# Patient Record
Sex: Female | Born: 1955 | Race: Black or African American | Hispanic: No
Health system: Southern US, Community
[De-identification: ages and names within clinical notes are randomized; demographics above are authoritative.]

---

## 1999-05-22 ENCOUNTER — Ambulatory Visit (HOSPITAL_COMMUNITY): Admission: RE | Admit: 1999-05-22 | Discharge: 1999-05-22 | Payer: Self-pay | Admitting: Neurosurgery

## 1999-05-22 ENCOUNTER — Encounter: Payer: Self-pay | Admitting: Neurosurgery

## 1999-11-13 ENCOUNTER — Encounter: Payer: Self-pay | Admitting: Neurosurgery

## 1999-11-15 ENCOUNTER — Inpatient Hospital Stay (HOSPITAL_COMMUNITY): Admission: RE | Admit: 1999-11-15 | Discharge: 1999-11-16 | Payer: Self-pay | Admitting: Neurosurgery

## 1999-11-15 ENCOUNTER — Encounter: Payer: Self-pay | Admitting: Neurosurgery

## 1999-12-18 ENCOUNTER — Encounter: Payer: Self-pay | Admitting: Neurosurgery

## 1999-12-18 ENCOUNTER — Encounter: Admission: RE | Admit: 1999-12-18 | Discharge: 1999-12-18 | Payer: Self-pay | Admitting: Neurosurgery

## 2000-07-16 ENCOUNTER — Ambulatory Visit (HOSPITAL_COMMUNITY): Admission: RE | Admit: 2000-07-16 | Discharge: 2000-07-16 | Payer: Self-pay | Admitting: Neurosurgery

## 2000-07-16 ENCOUNTER — Encounter: Payer: Self-pay | Admitting: Neurosurgery

## 2001-07-16 ENCOUNTER — Encounter: Payer: Self-pay | Admitting: Neurosurgery

## 2001-07-16 ENCOUNTER — Ambulatory Visit (HOSPITAL_COMMUNITY): Admission: RE | Admit: 2001-07-16 | Discharge: 2001-07-16 | Payer: Self-pay | Admitting: Neurosurgery

## 2004-07-30 ENCOUNTER — Emergency Department: Payer: Self-pay | Admitting: Emergency Medicine

## 2005-05-05 ENCOUNTER — Emergency Department: Payer: Self-pay | Admitting: Emergency Medicine

## 2005-11-22 ENCOUNTER — Emergency Department: Payer: Self-pay | Admitting: Emergency Medicine

## 2006-10-11 IMAGING — CT CT ABD-PELV W/O CM
1 of 2 series · 16 of 32 positions shown, 20 images · non-contrast
Comparison: none

REASON FOR EXAM: Kidney stone
COMMENTS:

PROCEDURE:     CT  - CT ABDOMEN AND PELVIS W[DATE]  [DATE]
RESULT:     The liver and spleen are normal.  The adrenals are normal.  The
pancreas is normal.  No focal renal abnormalities are identified.  There is
no bowel distention.  No evidence of obstructing ureteral stone.

[Series 2: soft tissue · axial · 0.79mm/px · z∈[-778,-400]mm · 16 of 141 slices shown, 20 images]
[im 10/141  soft-tissue]
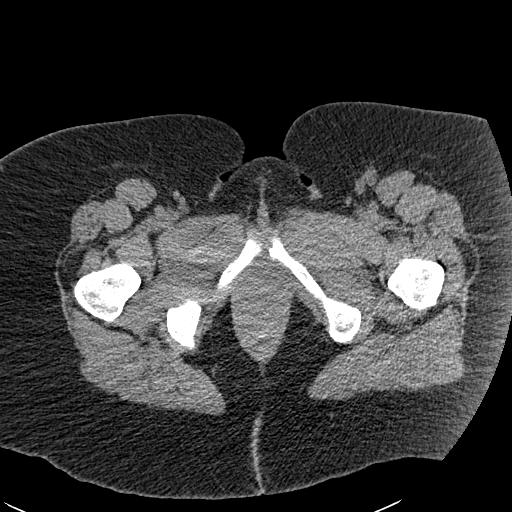
[im 10/141  bone]
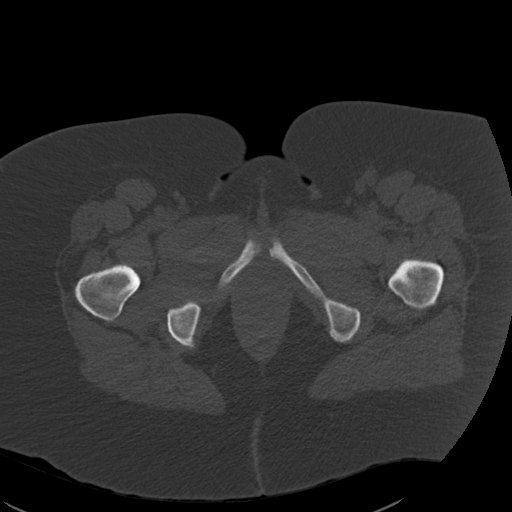
[im 20/141  soft-tissue]
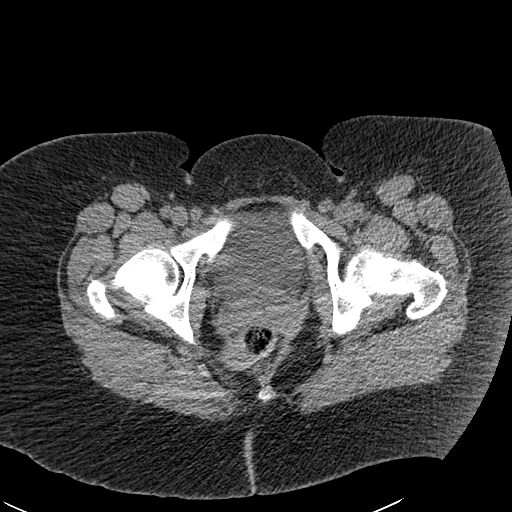
[im 29/141  soft-tissue]
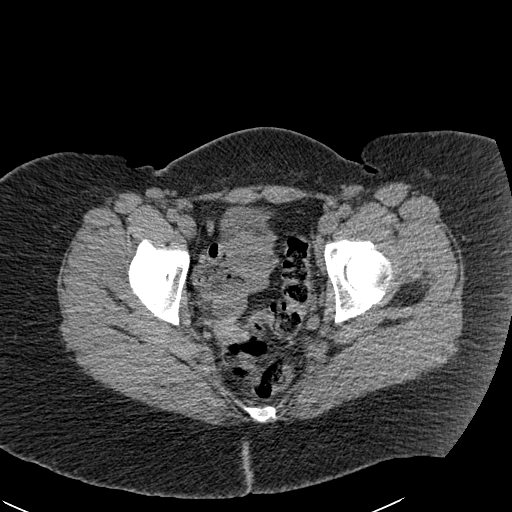
[im 39/141  soft-tissue]
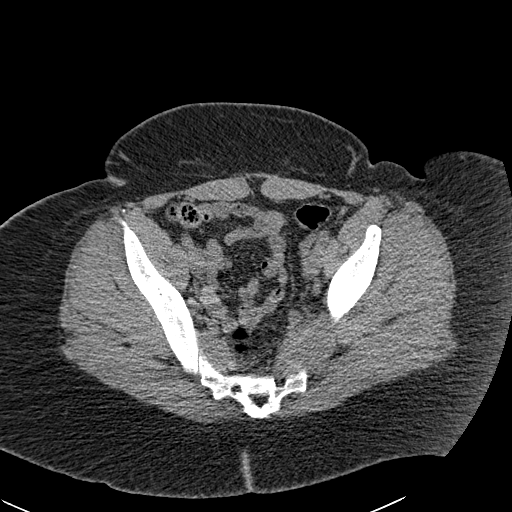
[im 49/141  soft-tissue]
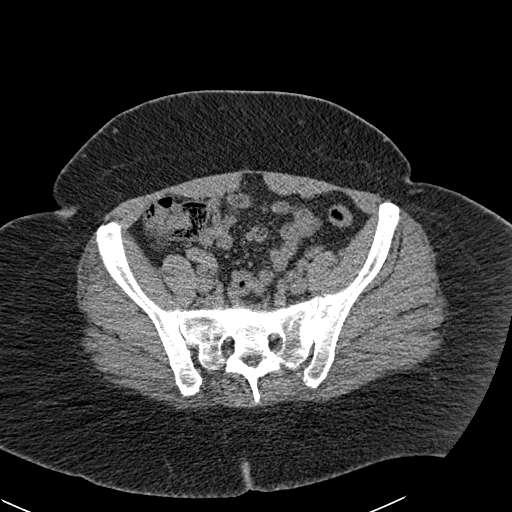
[im 58/141  soft-tissue]
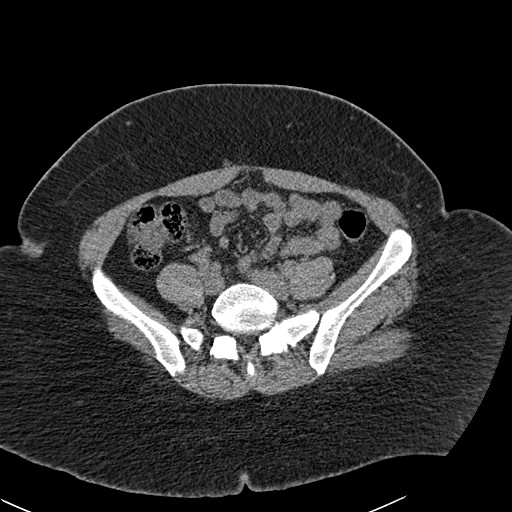
[im 68/141  soft-tissue]
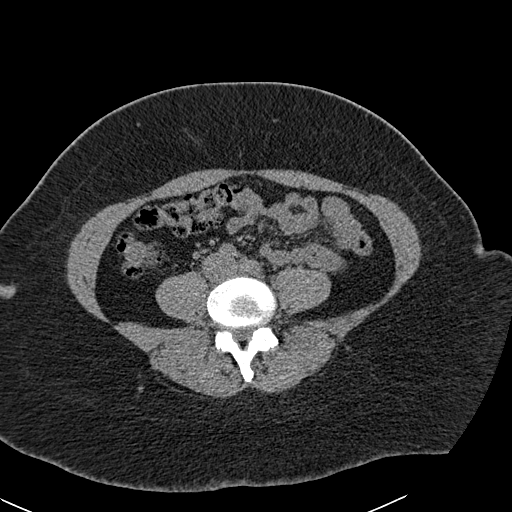
[im 78/141  soft-tissue]
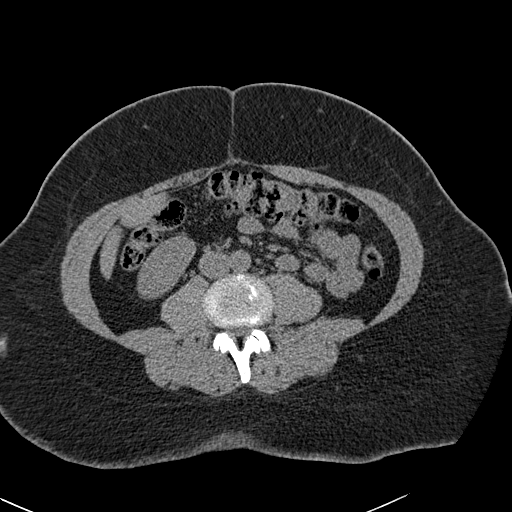
[im 87/141  soft-tissue]
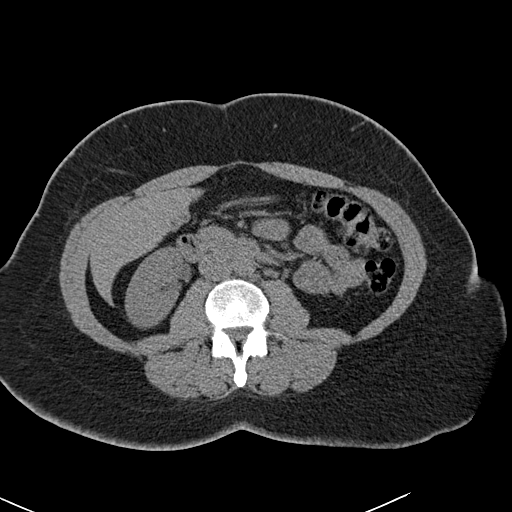
[im 87/141  bone]
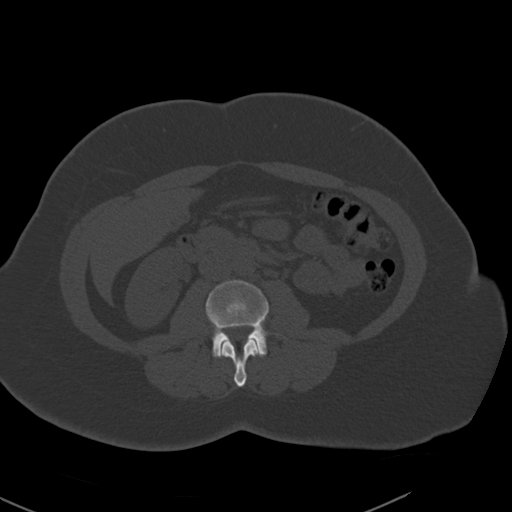
[im 97/141  soft-tissue]
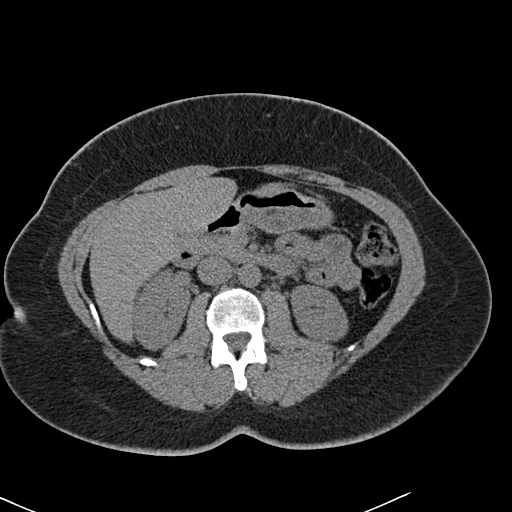
[im 107/141  soft-tissue]
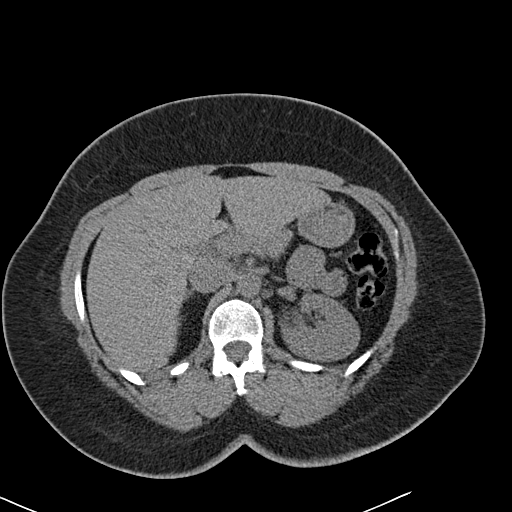
[im 116/141  soft-tissue]
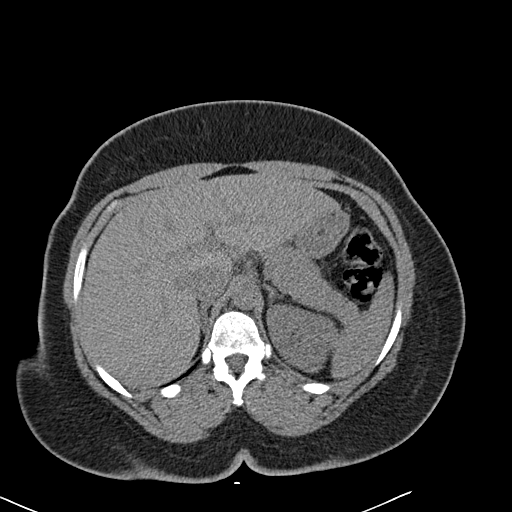
[im 121/141  lung]
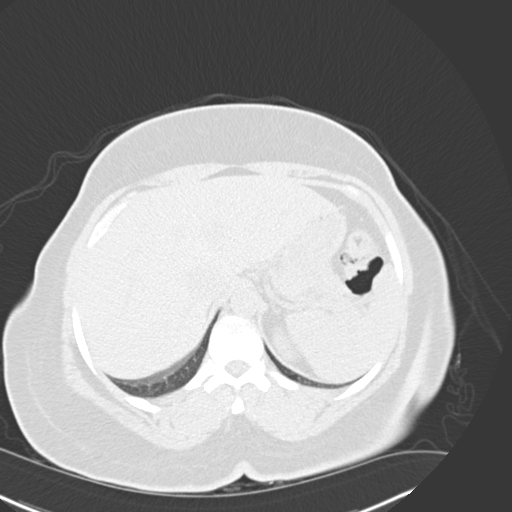
[im 126/141  soft-tissue]
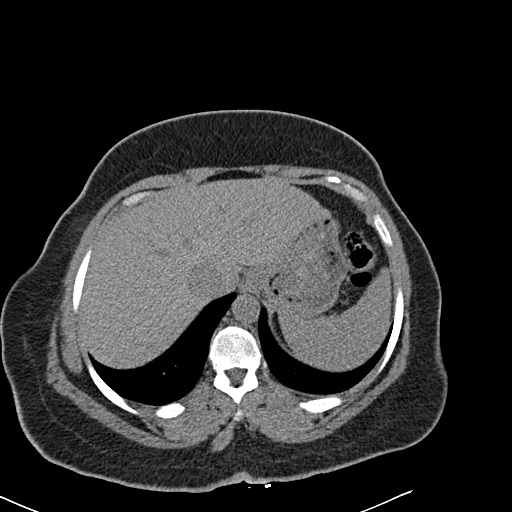
[im 126/141  lung]
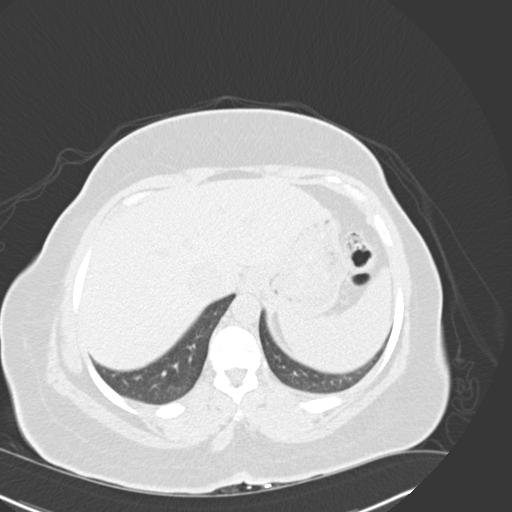
[im 131/141  lung]
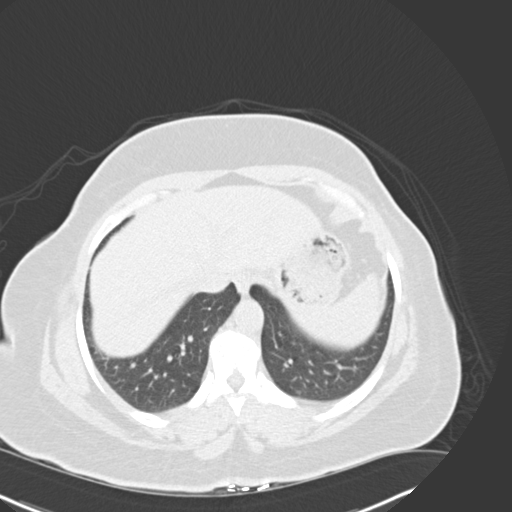
[im 136/141  soft-tissue]
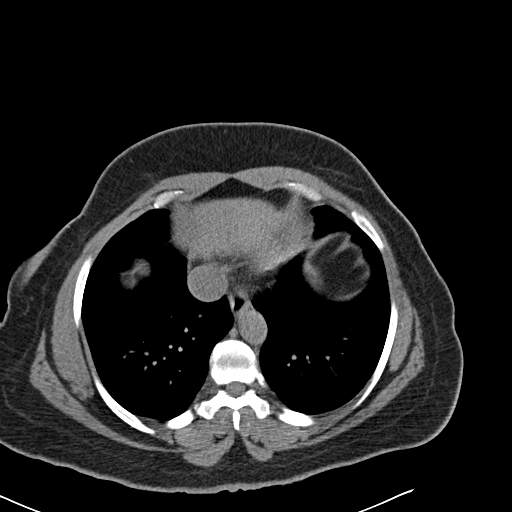
[im 136/141  lung]
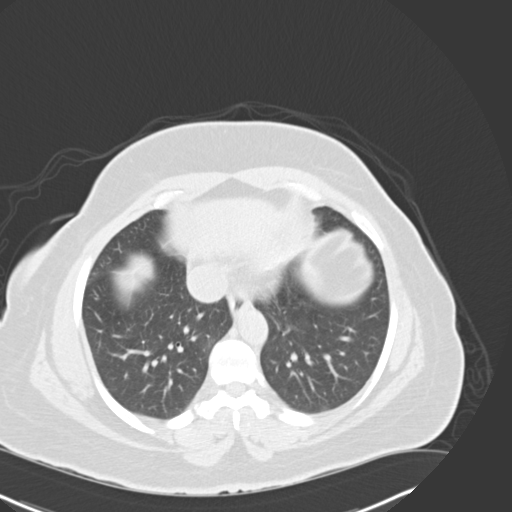

[16 of 32 positions shown; findings below may reference images not displayed]

IMPRESSION: No significant abnormality is identified.

## 2008-06-09 ENCOUNTER — Emergency Department: Payer: Self-pay | Admitting: Emergency Medicine

## 2009-11-15 IMAGING — CR RIGHT FOOT COMPLETE - 3+ VIEW
1 series · 3 of 3 positions shown · non-contrast
Comparison: none

REASON FOR EXAM: Injury
COMMENTS:   LMP: Post-Menopausal

PROCEDURE:     DXR - DXR FOOT RT COMPLETE W/OBLIQUES  - June 09, 2008 [DATE]
RESULT:     No fracture, dislocation or other acute bony abnormality is
identified. No radiodense soft tissue foreign body is seen.

[Series 1: view not recorded · 0.17mm/px · 3 of 3 slices shown]
[im 1/3]
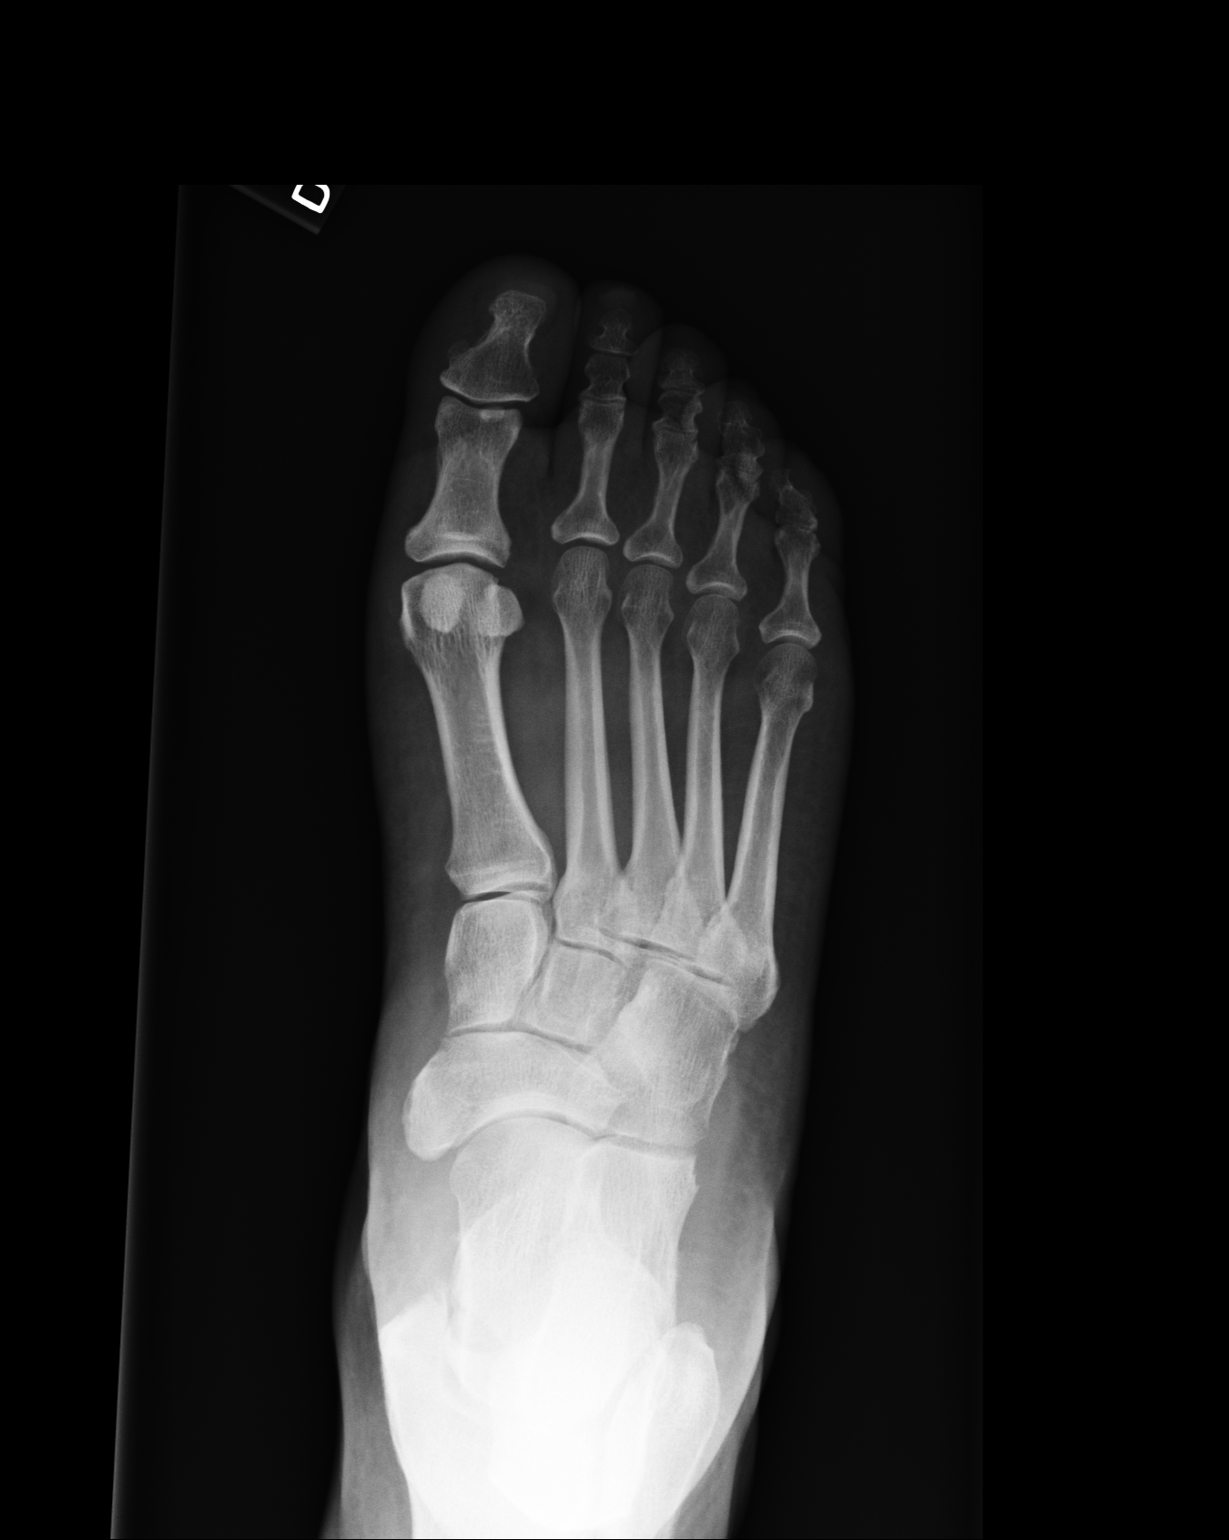
[im 2/3]
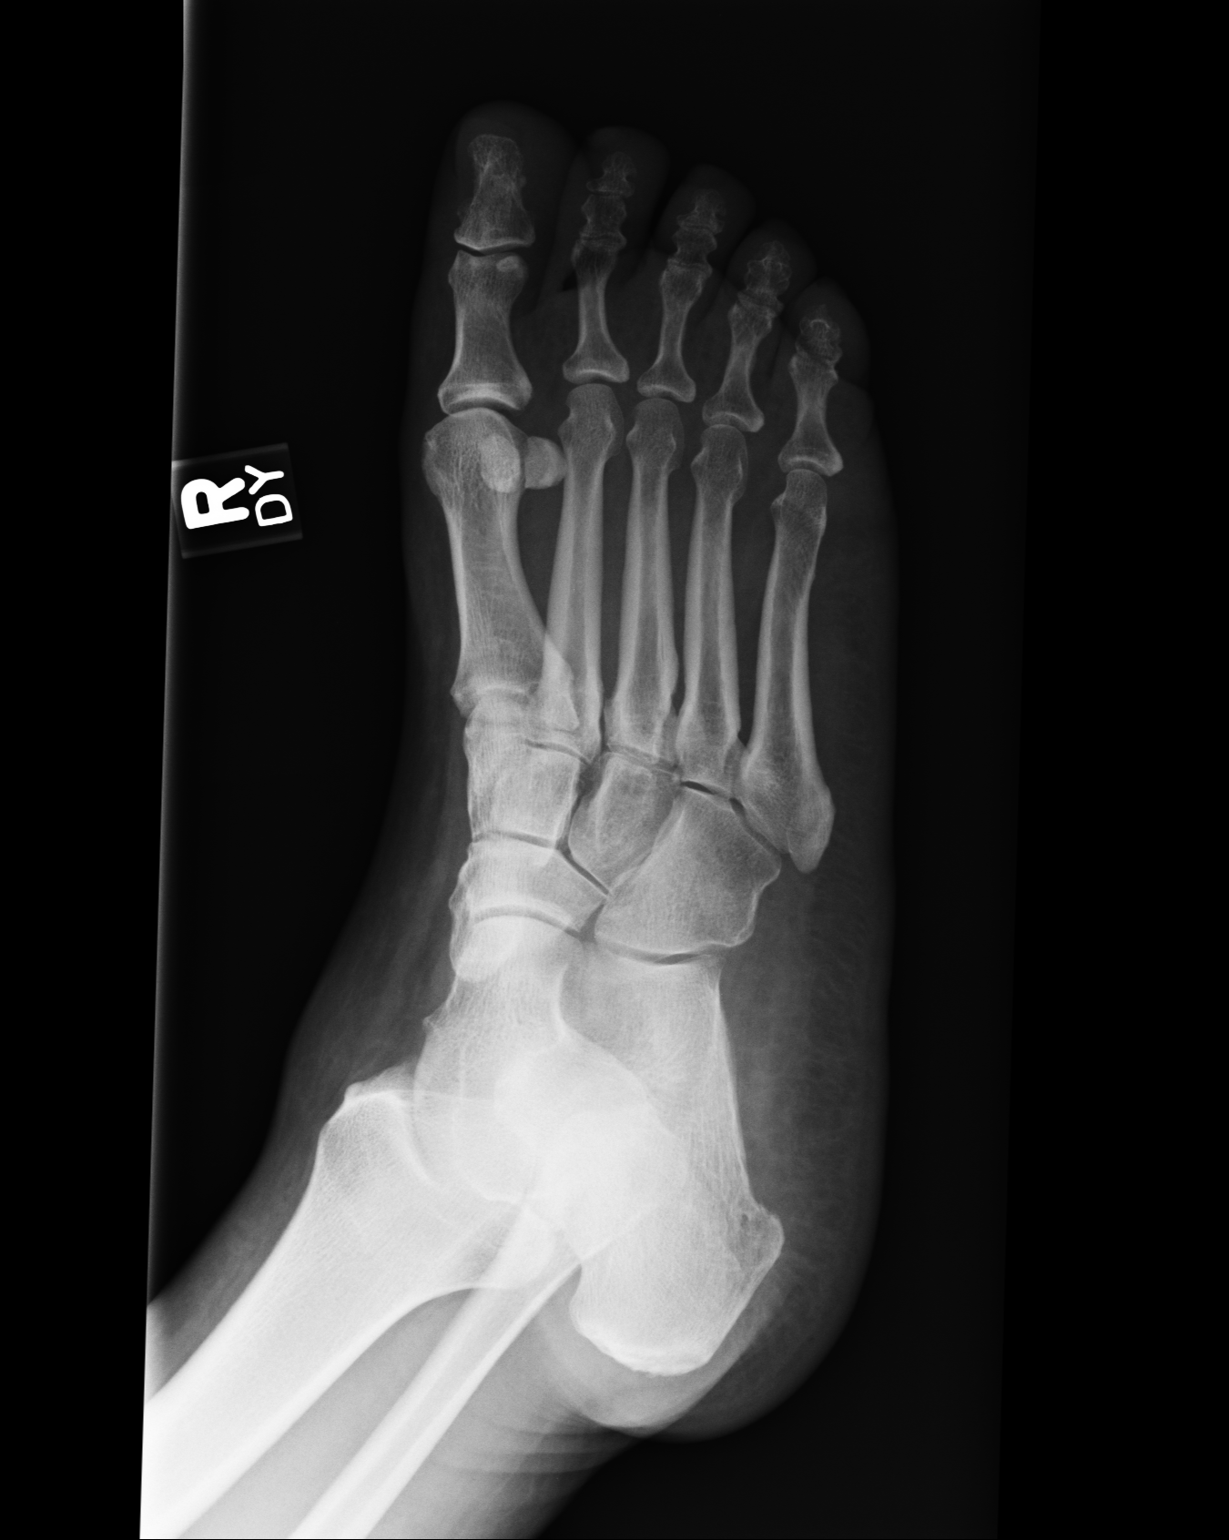
[im 3/3]
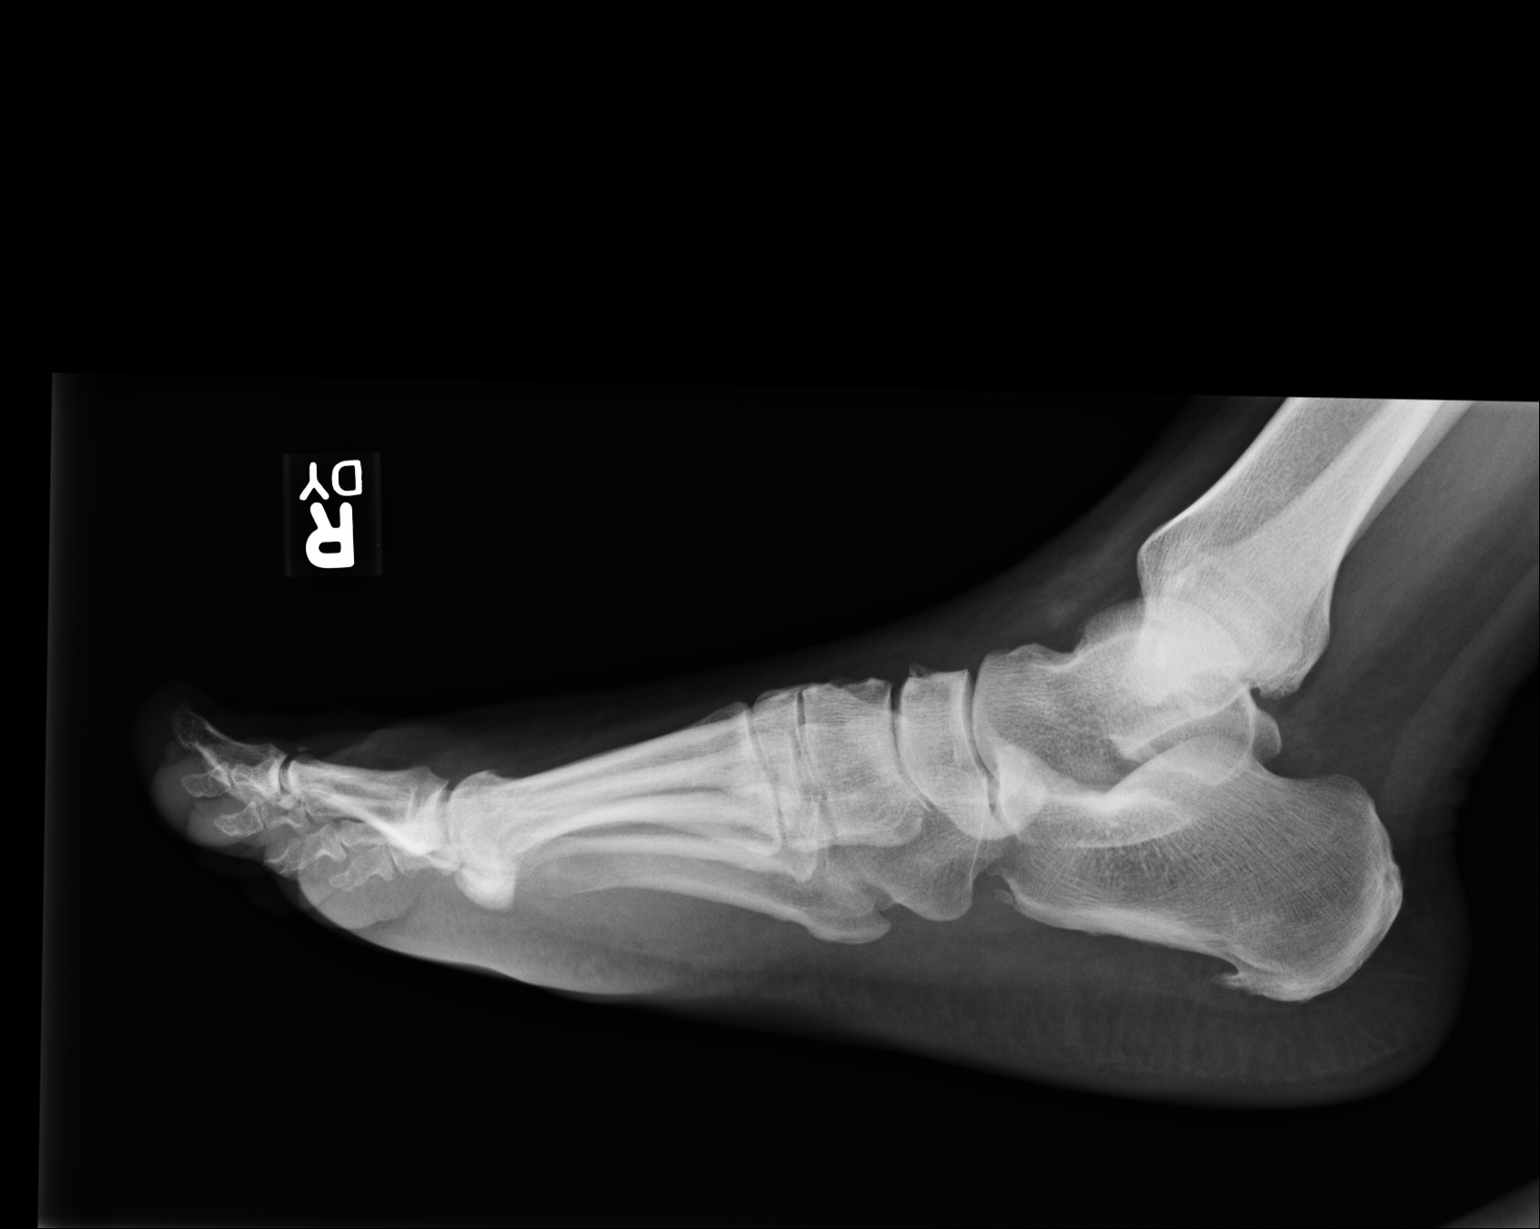

[3 of 3 positions shown; findings below may reference images not displayed]

IMPRESSION: 1.  No acute changes are identified.
2.  In the lateral view there is observed a plantar calcaneal spur.

## 2013-09-20 ENCOUNTER — Emergency Department: Payer: Self-pay | Admitting: Emergency Medicine

## 2014-04-14 ENCOUNTER — Emergency Department: Payer: Self-pay | Admitting: Emergency Medicine

## 2014-04-14 LAB — URINALYSIS, COMPLETE
Bilirubin,UR: NEGATIVE
Blood: NEGATIVE
Glucose,UR: NEGATIVE mg/dL (ref 0–75)
Ketone: NEGATIVE
Nitrite: NEGATIVE
Ph: 5 (ref 4.5–8.0)
Protein: NEGATIVE
RBC,UR: 1 /HPF (ref 0–5)
Specific Gravity: 1.011 (ref 1.003–1.030)
Squamous Epithelial: 9
WBC UR: 7 /HPF (ref 0–5)

## 2014-04-14 LAB — BASIC METABOLIC PANEL
Anion Gap: 7 (ref 7–16)
BUN: 14 mg/dL (ref 7–18)
Calcium, Total: 9.1 mg/dL (ref 8.5–10.1)
Chloride: 104 mmol/L (ref 98–107)
Co2: 28 mmol/L (ref 21–32)
Creatinine: 0.96 mg/dL (ref 0.60–1.30)
EGFR (African American): >60
EGFR (Non-African Amer.): >60
Glucose: 71 mg/dL (ref 65–99)
Osmolality: 276 (ref 275–301)
Potassium: 3.6 mmol/L (ref 3.5–5.1)
Sodium: 139 mmol/L (ref 136–145)

## 2014-04-14 LAB — CBC WITH DIFFERENTIAL/PLATELET
Basophil #: 0.1 10*3/uL (ref 0.0–0.1)
Basophil %: 0.7 %
Eosinophil #: 0.2 10*3/uL (ref 0.0–0.7)
Eosinophil %: 2.6 %
HCT: 38.3 % (ref 35.0–47.0)
HGB: 12.9 g/dL (ref 12.0–16.0)
Lymphocyte #: 3.1 10*3/uL (ref 1.0–3.6)
Lymphocyte %: 34.8 %
MCH: 30.4 pg (ref 26.0–34.0)
MCHC: 33.6 g/dL (ref 32.0–36.0)
MCV: 91 fL (ref 80–100)
Monocyte #: 0.6 x10 3/mm (ref 0.2–0.9)
Monocyte %: 7.1 %
Neutrophil #: 4.8 10*3/uL (ref 1.4–6.5)
Neutrophil %: 54.8 %
Platelet: 238 10*3/uL (ref 150–440)
RBC: 4.24 10*6/uL (ref 3.80–5.20)
RDW: 13.7 % (ref 11.5–14.5)
WBC: 8.8 10*3/uL (ref 3.6–11.0)

## 2014-04-14 LAB — TROPONIN I: Troponin-I: <0.02 ng/mL

## 2016-07-04 ENCOUNTER — Ambulatory Visit: Payer: Self-pay

## 2016-07-04 ENCOUNTER — Encounter (INDEPENDENT_AMBULATORY_CARE_PROVIDER_SITE_OTHER): Payer: Self-pay

## 2016-07-10 ENCOUNTER — Telehealth: Payer: Self-pay

## 2016-07-10 NOTE — Telephone Encounter (Signed)
Called pt to let her know I sent out application for the clinic and she will need to fill out as well as bring in the paper work we need to be eligible for the clinic. PT verbalized understanding.

## 2017-01-15 ENCOUNTER — Telehealth: Payer: Self-pay | Admitting: Pharmacist

## 2017-01-15 NOTE — Telephone Encounter (Signed)
01/15/17 Placed refill online with [facility] for Ventolin & Flovent, to ship 02/11/17

## 2017-03-20 ENCOUNTER — Telehealth: Payer: Self-pay | Admitting: Pharmacist

## 2017-03-20 NOTE — Telephone Encounter (Signed)
03/20/17 Placed refill online with [facility] for Flovent HFA 110 & Ventolin HFA, to release 04/18/17, order# [id number].

## 2017-03-28 ENCOUNTER — Telehealth: Payer: Self-pay | Admitting: Pharmacy Technician

## 2017-03-28 NOTE — Telephone Encounter (Signed)
Spoke with patient.  Made aware that we need last 30 days of bank statements, [facility] [place] [facility], and 401K statement.  Patient indicated that she would provide information within the next two weeks.  Sherilyn DacostaBetty J. Schmitt Bail Care Manager [facility]

## 2017-06-13 ENCOUNTER — Telehealth: Payer: Self-pay | Admitting: Pharmacy Technician

## 2017-06-13 NOTE — Telephone Encounter (Signed)
Patient failed to provide 2018 proof of income.  No additional medication assistance will be provided by [facility] without the required proof of income documentation.  Patient notified by letter.  Betty J. Kluttz Care Manager [facility] 

## 2023-03-05 ENCOUNTER — Other Ambulatory Visit: Payer: Self-pay
